# Patient Record
Sex: Male | Born: 2000
Health system: Southern US, Community
[De-identification: ages and names within clinical notes are randomized; demographics above are authoritative.]

## PROBLEM LIST (undated history)

## (undated) DIAGNOSIS — J329 Chronic sinusitis, unspecified: Secondary | ICD-10-CM

## (undated) DIAGNOSIS — S0990XA Unspecified injury of head, initial encounter: Secondary | ICD-10-CM

## (undated) DIAGNOSIS — R51 Headache: Secondary | ICD-10-CM

## (undated) HISTORY — DX: Headache: R51

## (undated) HISTORY — DX: Unspecified injury of head, initial encounter: S09.90XA

---
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
## 2019-01-16 DIAGNOSIS — U071 COVID-19: Secondary | ICD-10-CM | POA: Diagnosis not present

## 2019-01-16 DIAGNOSIS — Z20828 Contact with and (suspected) exposure to other viral communicable diseases: Secondary | ICD-10-CM | POA: Diagnosis not present

## 2019-01-23 DIAGNOSIS — U071 COVID-19: Secondary | ICD-10-CM | POA: Diagnosis not present

## 2019-04-03 ENCOUNTER — Telehealth (INDEPENDENT_AMBULATORY_CARE_PROVIDER_SITE_OTHER): Payer: Self-pay | Admitting: Pediatrics

## 2019-04-03 NOTE — Telephone Encounter (Signed)
error 

## 2020-11-03 ENCOUNTER — Encounter (INDEPENDENT_AMBULATORY_CARE_PROVIDER_SITE_OTHER): Payer: Self-pay

## 2020-12-30 IMAGING — CR ABDOMEN - 1 VIEW
1 series · 1 of 1 positions shown · non-contrast
Comparison: CT of the abdomen pelvis dated 08/08/2018

CLINICAL DATA: 18-year-old male with constipation.

EXAM:
ABDOMEN - 1 VIEW

[abdomen kub]
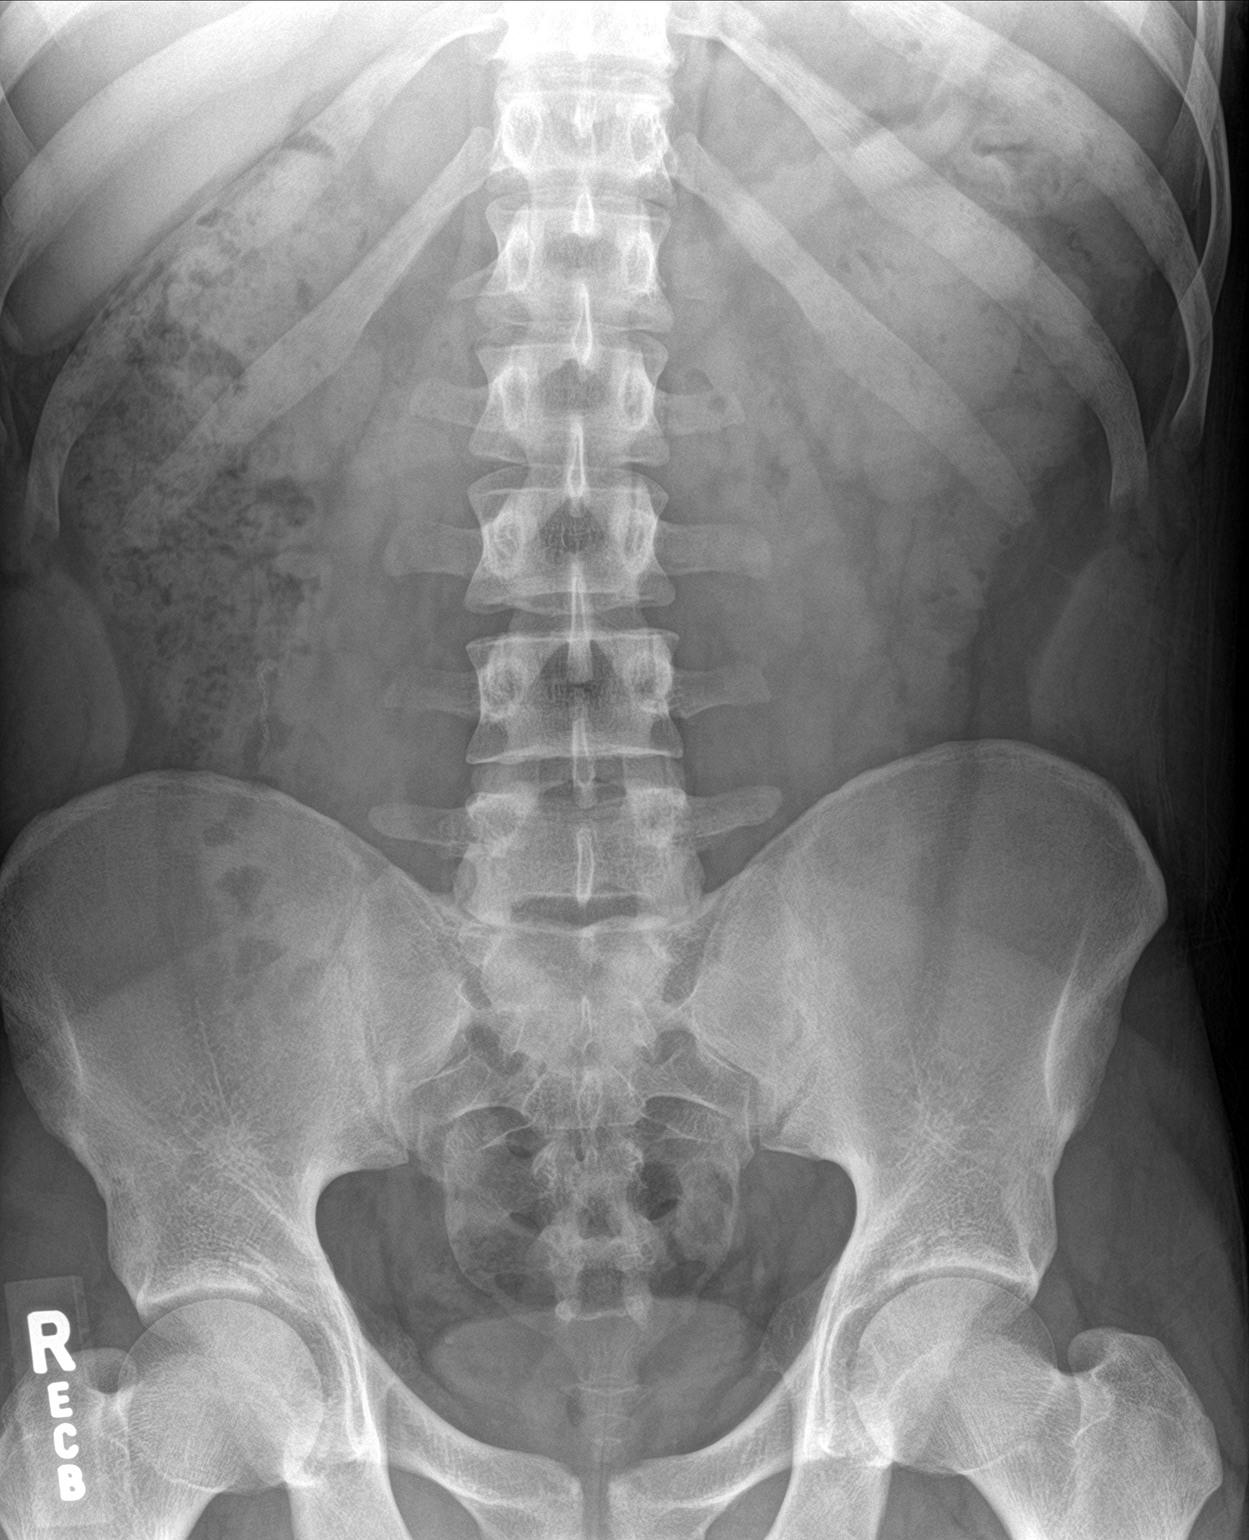

[1 of 1 positions shown; findings below may reference images not displayed]

FINDINGS: Mild-to-moderate chronic stool burden. No bowel dilatation or
evidence of obstruction. No free air or radiopaque calculi. Linear
surgical suture in the right lower quadrant likely related to
appendectomy. The osseous structures and soft tissues are
unremarkable.
IMPRESSION: No evidence of bowel obstruction.  No significant stool burden.

## 2021-01-17 ENCOUNTER — Encounter: Payer: Self-pay | Admitting: Dermatology

## 2021-01-17 ENCOUNTER — Other Ambulatory Visit: Payer: Self-pay

## 2021-01-17 ENCOUNTER — Ambulatory Visit: Payer: BC Managed Care – PPO | Admitting: Dermatology

## 2021-01-17 DIAGNOSIS — B360 Pityriasis versicolor: Secondary | ICD-10-CM

## 2021-01-17 MED ORDER — KETOCONAZOLE 2 % EX SHAM: MEDICATED_SHAMPOO | CUTANEOUS | 3 refills | Status: AC

## 2021-01-17 MED ORDER — FLUCONAZOLE 100 MG PO TABS: ORAL_TABLET | ORAL | 1 refills | Status: DC

## 2021-01-27 ENCOUNTER — Encounter: Payer: Self-pay | Admitting: Dermatology

## 2021-01-27 NOTE — Progress Notes (Signed)
   New Patient   Subjective  Luke Ayers is a 20 y.o. male who presents for the following: Annual Exam (Small sun spots on the back).  Spots on torso Location:  Duration:  Quality:  Associated Signs/Symptoms: Modifying Factors:  Severity:  Timing: Context:    The following portions of the chart were reviewed this encounter and updated as appropriate:  Tobacco  Allergies  Meds  Problems  Med Hx  Surg Hx  Fam Hx      Objective  Well appearing patient in no apparent distress; mood and affect are within normal limits. Head, Torso - Posterior (Back) Subtle but extensive branny salmon-colored scaly patches on torso, particularly back.  KOH positive.    All skin waist up examined.  Assessment & Plan  Tinea versicolor Torso - Posterior (Back); Head  Fluconazole 100 mg tablets, 2 pills by mouth on days 1, 15, and 29.  Told it may take several months after the last dose to see any improvement.  Follow-up by MyChart or telephone in 2 months.  Related Medications fluconazole (DIFLUCAN) 100 MG tablet Take 2 po on day1 repeat day 15 and 29  ketoconazole (NIZORAL) 2 % shampoo Apply to scalp qhs to prn

## 2022-08-01 ENCOUNTER — Encounter: Payer: Self-pay | Admitting: Gastroenterology

## 2022-09-04 ENCOUNTER — Ambulatory Visit: Payer: BC Managed Care – PPO | Admitting: Gastroenterology

## 2022-09-04 ENCOUNTER — Encounter: Payer: Self-pay | Admitting: Gastroenterology

## 2022-09-04 ENCOUNTER — Other Ambulatory Visit (INDEPENDENT_AMBULATORY_CARE_PROVIDER_SITE_OTHER): Payer: BC Managed Care – PPO

## 2022-09-04 VITALS — BP 102/74 | HR 94 | Ht 69.0 in | Wt 216.0 lb

## 2022-09-04 DIAGNOSIS — R195 Other fecal abnormalities: Secondary | ICD-10-CM | POA: Diagnosis not present

## 2022-09-04 DIAGNOSIS — K5900 Constipation, unspecified: Secondary | ICD-10-CM | POA: Diagnosis not present

## 2022-09-04 DIAGNOSIS — R1084 Generalized abdominal pain: Secondary | ICD-10-CM

## 2022-09-04 DIAGNOSIS — R140 Abdominal distension (gaseous): Secondary | ICD-10-CM | POA: Diagnosis not present

## 2022-09-04 LAB — CBC WITH DIFFERENTIAL/PLATELET
Basophils Absolute: 0 10*3/uL (ref 0.0–0.1)
Basophils Relative: 0.4 % (ref 0.0–3.0)
Eosinophils Absolute: 0.1 10*3/uL (ref 0.0–0.7)
Eosinophils Relative: 1.3 % (ref 0.0–5.0)
HCT: 44.7 % (ref 39.0–52.0)
Hemoglobin: 15.3 g/dL (ref 13.0–17.0)
Lymphocytes Relative: 17.9 % (ref 12.0–46.0)
Lymphs Abs: 2 10*3/uL (ref 0.7–4.0)
MCHC: 34.2 g/dL (ref 30.0–36.0)
MCV: 88.3 fl (ref 78.0–100.0)
Monocytes Absolute: 1.2 10*3/uL — ABNORMAL HIGH (ref 0.1–1.0)
Monocytes Relative: 10.9 % (ref 3.0–12.0)
Neutro Abs: 7.8 10*3/uL — ABNORMAL HIGH (ref 1.4–7.7)
Neutrophils Relative %: 69.5 % (ref 43.0–77.0)
Platelets: 287 10*3/uL (ref 150.0–400.0)
RBC: 5.06 Mil/uL (ref 4.22–5.81)
RDW: 12.7 % (ref 11.5–15.5)
WBC: 11.3 10*3/uL — ABNORMAL HIGH (ref 4.0–10.5)

## 2022-09-04 LAB — COMPREHENSIVE METABOLIC PANEL
ALT: 37 U/L (ref 0–53)
AST: 17 U/L (ref 0–37)
Albumin: 4.7 g/dL (ref 3.5–5.2)
Alkaline Phosphatase: 60 U/L (ref 39–117)
BUN: 18 mg/dL (ref 6–23)
CO2: 28 mEq/L (ref 19–32)
Calcium: 10.2 mg/dL (ref 8.4–10.5)
Chloride: 102 mEq/L (ref 96–112)
Creatinine, Ser: 1.03 mg/dL (ref 0.40–1.50)
GFR: 102.68 mL/min (ref >60.00)
Glucose, Bld: 85 mg/dL (ref 70–99)
Potassium: 4.5 mEq/L (ref 3.5–5.1)
Sodium: 139 mEq/L (ref 135–145)
Total Bilirubin: 0.8 mg/dL (ref 0.2–1.2)
Total Protein: 7.6 g/dL (ref 6.0–8.3)

## 2022-09-04 LAB — TSH: TSH: 0.71 u[IU]/mL (ref 0.35–5.50)

## 2022-09-04 NOTE — Progress Notes (Signed)
Chief Complaint: Bloating, abdominal pain   Referring Provider:     Self    HPI:     Luke Ayers is a 22 y.o. male presenting [facility] for evaluation of bloating and abdominal pain.   Describes intermittent postprandial abdominal cramping along with generalized abdominal bloating.  Pain tends to be 20-30 mins after eating. Bloating tends to be worse after pasta, greasy food, and heavy meals.   Has had "stomach issues my whole life". Worse over the last 3-4 months. Tried Gas-X with improvement.   No hematochezia or melena.   Additionally, c/o alternating bowel habits between normal and constipation. Bloating worse with times of constipation. Has trialed stool softeners, MoM, Miralax with overall improvement, but then sxs return.    Normal CBC and CMP in 08/2020.  Otherwise no recent labs for review.  No recent abdominal imaging for review.  No previous EGD or colonoscopy.  Sister with "GI issues", but does not know details (he thinks it might be IBS). Otherwise, no known family history of CRC, GI malignancy, liver disease, pancreatic disease, or IBD.    Past Medical History:  Diagnosis Date   Headache(784.0)    Minor head injury 11/09/2007   Fall at home   Sinus infection    chronic     Past Surgical History:  Procedure Laterality Date   ADENOIDECTOMY  2012   CIRCUMCISION  03/24/2003   LAPAROSCOPIC APPENDECTOMY N/A 08/08/2018   Procedure: APPENDECTOMY LAPAROSCOPIC;  Surgeon: Luke Boston, MD;  Location: [facility];  Service: General;  Laterality: N/A;   MYRINGOTOMY WITH TUBE PLACEMENT Bilateral 07/10/2000   TONSILLECTOMY Bilateral 06/16/2018   Procedure: TONSILLECTOMY;  Surgeon: Luke Gala, MD;  Location: [facility];  Service: ENT;  Laterality: Bilateral;   TONSILLECTOMY N/A 06/22/2018   Procedure: POST OP TONSIL BLEED;  Surgeon: Luke Marble, MD;  Location: [facility];  Service: ENT;  Laterality: N/A;   Family History   Problem Relation Age of Onset   Stroke Maternal Uncle    Stroke Maternal Grandmother    Stroke Maternal Grandfather        Died at 49   Other Paternal Grandmother        Problems with Liver died at 32   Colon cancer Neg Hx    Colon polyps Neg Hx    Esophageal cancer Neg Hx    Pancreatic cancer Neg Hx    Stomach cancer Neg Hx    Social History   Tobacco Use   Smoking status: Former    Types: E-cigarettes   Smokeless tobacco: Never   Tobacco comments:    not everyday last smoked a month ago  Vaping Use   Vaping Use: Former  Substance Use Topics   Alcohol use: Yes    Comment: 2 - 3 drinks every 2 weeks   Drug use: No   Current Outpatient Medications  Medication Sig Dispense Refill   ketoconazole (NIZORAL) 2 % shampoo Apply to scalp qhs to prn 120 mL 3   No current facility-administered medications for this visit.   No Known Allergies   Review of Systems: All systems reviewed and negative except where noted in HPI.     Physical Exam:    Wt Readings from Last 3 Encounters:  09/04/22 216 lb (98 kg)  09/29/18 200 lb (90.7 kg) (93 %, Z= 1.47)*  08/08/18 185 lb (83.9 kg) (86 %, Z= 1.09)*   *  Growth percentiles are based on CDC (Boys, 2-20 Years) data.    BP 102/74   Pulse 94   Ht '5\' 9"'$  (1.753 m)   Wt 216 lb (98 kg)   SpO2 96%   BMI 31.90 kg/m  Constitutional:  Pleasant, in no acute distress. Psychiatric: Normal mood and affect. Behavior is normal. Cardiovascular: Normal rate, regular rhythm. No edema Pulmonary/chest: Effort normal and breath sounds normal. No wheezing, rales or rhonchi. Abdominal: Mild TTP in epigastrium.  No rebound, guarding, peritoneal signs.  Soft, nondistended, nontender. Bowel sounds active throughout. There are no masses palpable. No hepatomegaly. Neurological: Alert and oriented to person place and time. Skin: Skin is warm and dry. No rashes noted.   ASSESSMENT AND PLAN;   1) Abdominal bloating 2) Generalized abdominal pain 3)  Abdominal cramping 4) Change in bowel habits 5) Constipation Discussed the broad DDx for his presenting symptoms at length today.  DDx includes IBS, and less likely malabsorptive disorder, Celiac Disease, IBD, etc.  No red flag symptoms.  Discussed diagnostic options to include serologic studies, imaging, and endoscopic evaluation, and he prefers a more conservative approach as below:  - Check CBC, CMP, Celiac serology - Trial low FODMAP diet.  Provided with handout with detailed instructions today - MiraLAX-titrate to soft stools without straining to BM - If labs unremarkable and no change in symptoms, plan for cross-sectional imaging and likely endoscopic evaluation  RTC in 3-4 months or sooner as needed  [name], DO, FACG  09/04/2022, 2:03 PM   No ref. provider found

## 2022-09-04 NOTE — Patient Instructions (Addendum)
_______________________________________________________  If your blood pressure at your visit was 140/90 or greater, please contact your primary care physician to follow up on this.  _______________________________________________________  If you are age 23 or older, your body mass index should be between 23-30. Your Body mass index is 31.9 kg/m. If this is out of the aforementioned range listed, please consider follow up with your Primary Care Provider.  If you are age 65 or younger, your body mass index should be between 19-25. Your Body mass index is 31.9 kg/m. If this is out of the aformentioned range listed, please consider follow up with your Primary Care Provider.   ________________________________________________________  The [facility] providers would like to encourage you to use [facility] to communicate with providers for non-urgent requests or questions.  Due to long hold times on the telephone, sending your provider a message by [facility] may be a faster and more efficient way to get a response.  Please allow 48 business hours for a response.  Please remember that this is for non-urgent requests.  _______________________________________________________  Your provider has requested that you go to the basement level for lab work before leaving today. Press "B" on the elevator. The lab is located at the first door on the left as you exit the elevator.  Low FODMAP Diet: (Fermentable Oligosaccharides, Disaccharides, Monosaccharides, and Polyols) These are short chain carbohydrates and sugar alcohols that are poorly absorbed by the body, resulting in multiple abdominal symptoms, including changes in bowel habits, abdominal pain/discomfort, bloating, abdominal distension, gas, etc.      It was a pleasure to see you today!  Luke Ayers, D.O.

## 2022-09-05 LAB — TISSUE TRANSGLUTAMINASE, IGA: (tTG) Ab, IgA: <1 U/mL

## 2022-09-05 LAB — IGA: Immunoglobulin A: 237 mg/dL (ref 47–310)
